# Patient Record
Sex: Female | Born: 1955 | Race: White | Hispanic: No | Marital: Married | Smoking: Never smoker
Health system: Southern US, Community
[De-identification: ages and names within clinical notes are randomized; demographics above are authoritative.]

## PROBLEM LIST (undated history)

## (undated) DIAGNOSIS — I10 Essential (primary) hypertension: Secondary | ICD-10-CM

---

## 2012-09-17 ENCOUNTER — Ambulatory Visit: Payer: Self-pay

## 2016-07-18 ENCOUNTER — Ambulatory Visit (INDEPENDENT_AMBULATORY_CARE_PROVIDER_SITE_OTHER): Payer: Managed Care, Other (non HMO)

## 2016-07-18 ENCOUNTER — Ambulatory Visit
Admission: EM | Admit: 2016-07-18 | Discharge: 2016-07-18 | Disposition: A | Discharge status: Home / Self Care | Payer: Managed Care, Other (non HMO) | Attending: Family Medicine | Admitting: Family Medicine

## 2016-07-18 ENCOUNTER — Encounter: Payer: Self-pay | Admitting: Emergency Medicine

## 2016-07-18 DIAGNOSIS — S92912A Unspecified fracture of left toe(s), initial encounter for closed fracture: Secondary | ICD-10-CM

## 2016-07-18 HISTORY — DX: Essential (primary) hypertension: I10

## 2016-07-18 NOTE — ED Triage Notes (Signed)
Patient states that she hit her left 5th to on the coffee table 3 days ago.  Patient c/o pain swelling and bruising in her left 5th toe and foot.

## 2016-07-18 NOTE — ED Provider Notes (Signed)
[facility]    CSN: [id number] Arrival date & time: 07/18/16  1655  First Provider Contact:  None       History   Chief Complaint Chief Complaint  Patient presents with  . Toe Pain    left 5th toe    HPI Debra Huerta is a 60 y.o. female.   Patient states that she hit her left 5th to on the coffee table 3 days ago.  Patient c/o pain swelling and bruising in her left 5th toe.   The history is provided by the patient.    Past Medical History:  Diagnosis Date  . Hypertension     There are no active problems to display for this patient.   History reviewed. No pertinent surgical history.  OB History    No data available       Home Medications    Prior to Admission medications   Medication Sig Start Date End Date Taking? Authorizing Provider  buPROPion (WELLBUTRIN XL) 300 MG 24 hr tablet Take 300 mg by mouth daily.   Yes Historical Provider, MD  Cholecalciferol (VITAMIN D3) 1000 units CAPS Take 1 capsule by mouth daily.   Yes Historical Provider, MD  citalopram (CELEXA) 40 MG tablet Take 40 mg by mouth daily.   Yes Historical Provider, MD  LORazepam (ATIVAN) 0.5 MG tablet Take 0.5 mg by mouth 2 (two) times daily.   Yes Historical Provider, MD  triamterene-hydrochlorothiazide (DYAZIDE) 37.5-25 MG capsule Take 1 capsule by mouth daily.   Yes Historical Provider, MD  vitamin B-12 (CYANOCOBALAMIN) 500 MCG tablet Take 500 mcg by mouth daily.   Yes Historical Provider, MD    Family History History reviewed. No pertinent family history.  Social History Social History  Substance Use Topics  . Smoking status: Never Smoker  . Smokeless tobacco: Never Used  . Alcohol use Yes     Allergies   Review of patient's allergies indicates no known allergies.   Review of Systems Review of Systems   Physical Exam Triage Vital Signs ED Triage Vitals  Enc Vitals Group     BP 07/18/16 1742 115/70     Pulse Rate 07/18/16 1742 89     Resp 07/18/16 1742  16     Temp 07/18/16 1742 97.7 F (36.5 C)     Temp Source 07/18/16 1742 Tympanic     SpO2 07/18/16 1742 100 %     Weight 07/18/16 1740 160 lb (72.6 kg)     Height 07/18/16 1740 5\' 2"  (1.575 m)     Head Circumference --      Peak Flow --      Pain Score 07/18/16 1743 6     Pain Loc --      Pain Edu? --      Excl. in GC? --    No data found.   Updated Vital Signs BP 115/70 (BP Location: Left Arm)   Pulse 89   Temp 97.7 F (36.5 C) (Tympanic)   Resp 16   Ht 5\' 2"  (1.575 m)   Wt 160 lb (72.6 kg)   SpO2 100%   BMI 29.26 kg/m   Visual Acuity Right Eye Distance:   Left Eye Distance:   Bilateral Distance:    Right Eye Near:   Left Eye Near:    Bilateral Near:     Physical Exam  Constitutional: She appears well-developed and well-nourished. No distress.  Musculoskeletal:       Left foot: There is tenderness, bony  tenderness (over 5th toe) and swelling (and ecchymosis). There is normal range of motion, normal capillary refill, no crepitus, no deformity and no laceration.  Skin: She is not diaphoretic.  Nursing note and vitals reviewed.    UC Treatments / Results  Labs (all labs ordered are listed, but only abnormal results are displayed) Labs Reviewed - No data to display  EKG  EKG Interpretation None       Radiology Dg Toe 5th Left  Result Date: 07/18/2016 CLINICAL DATA:  Toe injury 5 days ago EXAM: DG TOE 5TH LEFT COMPARISON:  None. FINDINGS: Minimally displaced fracture proximal shaft of the fifth proximal phalanx. No other fracture or arthropathy. IMPRESSION: Minimally displaced fracture fifth proximal phalanx Electronically Signed   By: Marlan Palauharles  Clark M.D.   On: 07/18/2016 18:32    Procedures Procedures (including critical care time)  Medications Ordered in UC Medications - No data to display   Initial Impression / Assessment and Plan / UC Course  I have reviewed the triage vital signs and the nursing notes.  Pertinent labs & imaging results that  were available during my care of the patient were reviewed by me and considered in my medical decision making (see chart for details).  Clinical Course      Final Clinical Impressions(s) / UC Diagnoses   Final diagnoses:  Toe fracture, left, closed, initial encounter  (5th toe)  New Prescriptions Discharge Medication List as of 07/18/2016  7:13 PM    1. x-ray results and diagnosis reviewed with patient 2. Recommend treatment with buddy taping and hard soled shoe 3. Follow-up prn if symptoms worsen or don't improve   Payton Mccallumrlando Andalyn Heckstall, MD 07/18/16 2103

## 2018-03-05 IMAGING — CR DG TOE 5TH 2+V*L*
3 series · 3 of 3 positions shown · non-contrast
Comparison: None.

CLINICAL DATA: Toe injury 5 days ago

EXAM:
DG TOE 5TH LEFT

[toe ap]
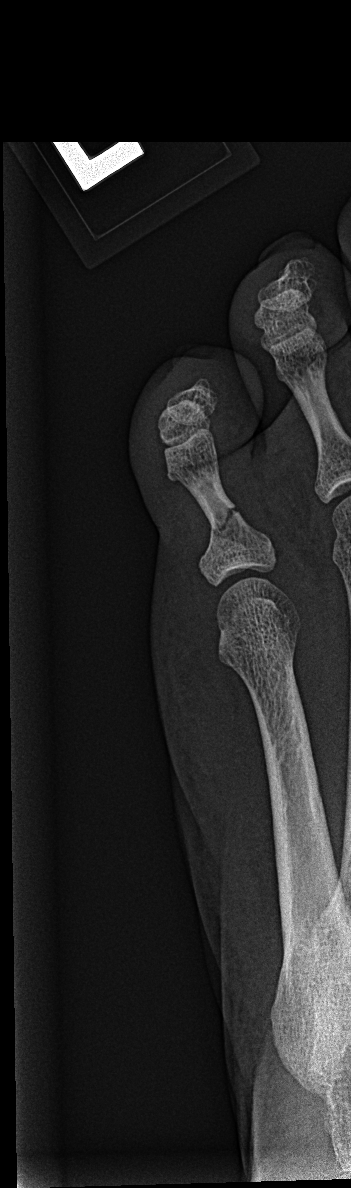

[toe obl]
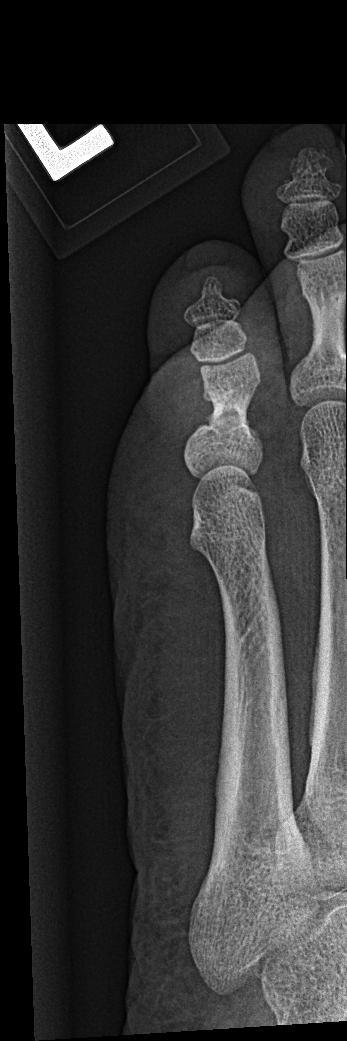

[toe lat]
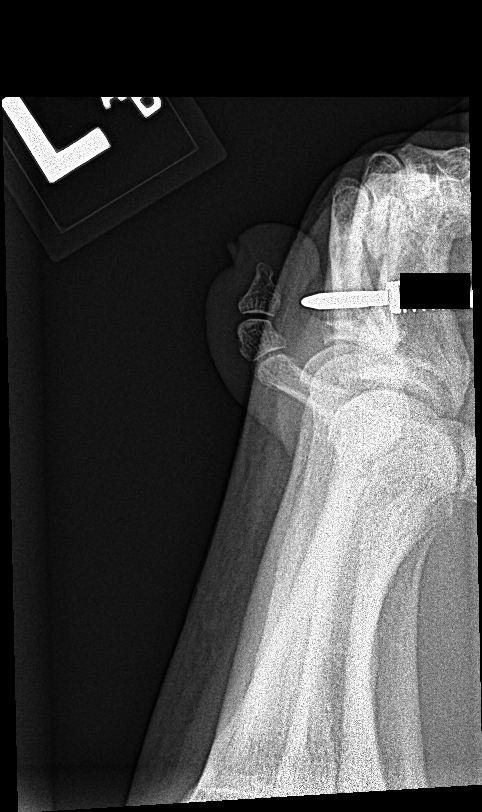

[3 of 3 positions shown; findings below may reference images not displayed]

FINDINGS: Minimally displaced fracture proximal shaft of the fifth proximal
phalanx. No other fracture or arthropathy.
IMPRESSION: Minimally displaced fracture fifth proximal phalanx
